# Patient Record
Sex: Female | Born: 1970 | Race: Black or African American | Marital: Single | Smoking: Never smoker
Health system: Southern US, Community
[De-identification: ages and names within clinical notes are randomized; demographics above are authoritative.]

## PROBLEM LIST (undated history)

## (undated) DIAGNOSIS — M549 Dorsalgia, unspecified: Secondary | ICD-10-CM

## (undated) DIAGNOSIS — E079 Disorder of thyroid, unspecified: Secondary | ICD-10-CM

## (undated) DIAGNOSIS — G43909 Migraine, unspecified, not intractable, without status migrainosus: Secondary | ICD-10-CM

## (undated) HISTORY — DX: Disorder of thyroid, unspecified: E07.9

## (undated) HISTORY — DX: Dorsalgia, unspecified: M54.9

## (undated) HISTORY — PX: THYROIDECTOMY, PARTIAL: SHX18

## (undated) HISTORY — DX: Migraine, unspecified, not intractable, without status migrainosus: G43.909

---

## 2020-06-14 DIAGNOSIS — E890 Postprocedural hypothyroidism: Secondary | ICD-10-CM | POA: Insufficient documentation

## 2022-01-29 LAB — COLOGUARD: COLOGUARD: NEGATIVE

## 2022-02-25 ENCOUNTER — Ambulatory Visit (INDEPENDENT_AMBULATORY_CARE_PROVIDER_SITE_OTHER): Payer: BC Managed Care – PPO | Admitting: Certified Nurse Midwife

## 2022-02-25 ENCOUNTER — Encounter: Payer: Self-pay | Admitting: Certified Nurse Midwife

## 2022-02-25 VITALS — BP 140/85 | HR 62 | Ht 66.5 in | Wt 232.4 lb

## 2022-02-25 DIAGNOSIS — Z1231 Encounter for screening mammogram for malignant neoplasm of breast: Secondary | ICD-10-CM

## 2022-02-25 DIAGNOSIS — Z01419 Encounter for gynecological examination (general) (routine) without abnormal findings: Secondary | ICD-10-CM | POA: Diagnosis not present

## 2022-02-25 DIAGNOSIS — Z23 Encounter for immunization: Secondary | ICD-10-CM | POA: Diagnosis not present

## 2022-02-25 MED ORDER — TETANUS-DIPHTH-ACELL PERTUSSIS 5-2.5-18.5 LF-MCG/0.5 IM SUSY
0.5000 mL | PREFILLED_SYRINGE | Freq: Once | INTRAMUSCULAR | Status: AC
  Administered 2022-02-25: 0.5 mL via INTRAMUSCULAR

## 2022-02-25 NOTE — Patient Instructions (Signed)

## 2022-02-25 NOTE — Progress Notes (Signed)
? ? ?GYNECOLOGY ANNUAL PREVENTATIVE CARE ENCOUNTER NOTE ? ?History:    ? Madison Fowler is a 51 y.o. [id number]. female here for a routine annual gynecologic exam.  Current complaints: none.   Denies abnormal vaginal bleeding, discharge, pelvic pain, problems with intercourse or other gynecologic concerns.  ?  ? ?Social ?Relationship: divorced , with current partner 15 yrs ( FOB of 2nd daughter) ?Living: with partner and 57 yr old daughter ?Work:  Clinical cytogeneticist [employer]  ?Exercise: few times week  ?Smoke/Alcohol/drug use: occasional alcohol, no smoking, vaping or drug use.  ? ?Gynecologic History ?No LMP recorded (lmp unknown). ?Contraception: IUD, Mirnea 2021 ?Last Pap: 04/08/2020. Results were: normal (NO HPV done) ?Last mammogram: 2019. Results were: normal per pt  ? ?Obstetric History ?OB History  ?No obstetric history on file.  ? ? ?Past Medical History:  ?Diagnosis Date  ? Back pain   ? Migraine   ? Thyroid disease   ? ? ?Past Surgical History:  ?Procedure Laterality Date  ? THYROIDECTOMY, PARTIAL Bilateral   ? ? ?No current outpatient medications on file prior to visit.  ? ?No current facility-administered medications on file prior to visit.  ? ? ?Not on File ? ?Social History:  reports that she has never smoked. She has never used smokeless tobacco. She reports that she does not currently use alcohol. She reports that she does not use drugs. ? ?History reviewed. No pertinent family history. ? ?The following portions of the patient's history were reviewed and updated as appropriate: allergies, current medications, past family history, past medical history, past social history, past surgical history and problem list. ? ?Review of Systems ?Pertinent items noted in HPI and remainder of comprehensive ROS otherwise negative. ? ?Physical Exam:  ?BP 140/85   Pulse 62   Ht 5' 6.5" (1.689 m)   Wt 232 lb 6.4 oz (105.4 kg)   LMP  (LMP Unknown)   BMI 36.95 kg/m?  ?CONSTITUTIONAL: Well-developed,  well-nourished, over weight female in no acute distress.  ?HENT:  Normocephalic, atraumatic, External right and left ear normal. Oropharynx is clear and moist ?EYES: Conjunctivae and EOM are normal. Pupils are equal, round, and reactive to light. No scleral icterus.  ?NECK: Normal range of motion, supple, no masses.  Normal thyroid.  ?SKIN: Skin is warm and dry. No rash noted. Not diaphoretic. No erythema. No pallor. ?MUSCULOSKELETAL: Normal range of motion. No tenderness.  No cyanosis, clubbing, or edema.  2+ distal pulses. ?NEUROLOGIC: Alert and oriented to person, place, and time. Normal reflexes, muscle tone coordination.  ?PSYCHIATRIC: Normal mood and affect. Normal behavior. Normal judgment and thought content. ?CARDIOVASCULAR: Normal heart rate noted, regular rhythm ?RESPIRATORY: Clear to auscultation bilaterally. Effort and breath sounds normal, no problems with respiration noted. ?BREASTS: Symmetric in size. No masses, tenderness, skin changes, nipple drainage, or lymphadenopathy bilaterally.  ?ABDOMEN: Soft, no distention noted.  No tenderness, rebound or guarding.  ?PELVIC: Normal appearing external genitalia and urethral meatus; normal appearing vaginal mucosa and cervix.  No abnormal discharge noted.  Pap smear not due. IUD strings present.  Normal uterine size, no other palpable masses, no uterine or adnexal tenderness.  . ?  ?Assessment and Plan:  ?  1. Women's annual routine gynecological examination ? ? ?Pap: due 2024 ?Mammogram : ordered ?Labs: declines ?Refills: none ?Colonoscopy: pt had colo Guard testing 2023-Negative ?Referral: none ?Routine preventative health maintenance measures emphasized. ?Please refer to After Visit Summary for other counseling recommendations.  ?   ? ?Philip Aspen, CNM ?  [facility] ?[facility]   ?

## 2022-04-08 ENCOUNTER — Ambulatory Visit
Admission: RE | Admit: 2022-04-08 | Discharge: 2022-04-08 | Disposition: A | Discharge status: Home / Self Care | Payer: BC Managed Care – PPO | Source: Ambulatory Visit | Attending: Certified Nurse Midwife | Admitting: Certified Nurse Midwife

## 2022-04-08 ENCOUNTER — Ambulatory Visit (INDEPENDENT_AMBULATORY_CARE_PROVIDER_SITE_OTHER): Payer: BC Managed Care – PPO | Admitting: Certified Nurse Midwife

## 2022-04-08 VITALS — BP 119/76 | HR 65

## 2022-04-08 DIAGNOSIS — Z0130 Encounter for examination of blood pressure without abnormal findings: Secondary | ICD-10-CM

## 2022-04-08 DIAGNOSIS — Z1231 Encounter for screening mammogram for malignant neoplasm of breast: Secondary | ICD-10-CM | POA: Diagnosis not present

## 2022-04-08 NOTE — Progress Notes (Signed)
Nurse visit only , for blood pressure check.  ? ?Philip Aspen, CNM  ?

## 2022-04-14 ENCOUNTER — Inpatient Hospital Stay
Admission: RE | Admit: 2022-04-14 | Discharge: 2022-04-14 | Disposition: A | Discharge status: Home / Self Care | Payer: Self-pay | Source: Ambulatory Visit | Attending: *Deleted | Admitting: *Deleted

## 2022-04-14 ENCOUNTER — Other Ambulatory Visit: Payer: Self-pay | Admitting: *Deleted

## 2022-04-14 DIAGNOSIS — Z1231 Encounter for screening mammogram for malignant neoplasm of breast: Secondary | ICD-10-CM

## 2023-01-25 ENCOUNTER — Other Ambulatory Visit: Payer: Self-pay | Admitting: Certified Nurse Midwife

## 2023-01-25 DIAGNOSIS — Z1231 Encounter for screening mammogram for malignant neoplasm of breast: Secondary | ICD-10-CM

## 2023-03-03 ENCOUNTER — Encounter: Payer: Self-pay | Admitting: Certified Nurse Midwife

## 2023-03-03 ENCOUNTER — Ambulatory Visit (INDEPENDENT_AMBULATORY_CARE_PROVIDER_SITE_OTHER): Payer: BC Managed Care – PPO | Admitting: Certified Nurse Midwife

## 2023-03-03 ENCOUNTER — Other Ambulatory Visit (HOSPITAL_COMMUNITY)
Admission: RE | Admit: 2023-03-03 | Discharge: 2023-03-03 | Disposition: A | Discharge status: Home / Self Care | Payer: BC Managed Care – PPO | Source: Ambulatory Visit | Attending: Certified Nurse Midwife | Admitting: Certified Nurse Midwife

## 2023-03-03 VITALS — BP 123/85 | HR 88 | Resp 16 | Ht 66.5 in | Wt 187.9 lb

## 2023-03-03 DIAGNOSIS — Z01419 Encounter for gynecological examination (general) (routine) without abnormal findings: Secondary | ICD-10-CM

## 2023-03-03 DIAGNOSIS — Z124 Encounter for screening for malignant neoplasm of cervix: Secondary | ICD-10-CM | POA: Diagnosis present

## 2023-03-03 DIAGNOSIS — Z113 Encounter for screening for infections with a predominantly sexual mode of transmission: Secondary | ICD-10-CM

## 2023-03-03 NOTE — Progress Notes (Signed)
GYNECOLOGY ANNUAL PREVENTATIVE CARE ENCOUNTER NOTE  History:     Madison Fowler is a 52 y.o. No obstetric history on file. female here for a routine annual gynecologic exam.  Current complaints: none.   Denies abnormal vaginal bleeding, discharge, pelvic pain, problems with intercourse or other gynecologic concerns.     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 (Menstrual status: IUD). Contraception: IUD Last Pap: 04/08/2020. Results were: normal Last mammogram: 5/17/223. Results were: normal  Obstetric History OB History  No obstetric history on file.    Past Medical History:  Diagnosis Date   Back pain    Migraine    Thyroid disease     Past Surgical History:  Procedure Laterality Date   THYROIDECTOMY, PARTIAL Bilateral     Current Outpatient Medications on File Prior to Visit  Medication Sig Dispense Refill   fluticasone (FLONASE) 50 MCG/ACT nasal spray 1 spray into each nostril daily.     ibuprofen (ADVIL) 800 MG tablet Take 1 tablet by mouth every 8 (eight) hours as needed.     levonorgestrel (MIRENA) 20 MCG/DAY IUD 1 each by Intrauterine route once.     levothyroxine (SYNTHROID) 75 MCG tablet Take 75 mcg by mouth daily.     No current facility-administered medications on file prior to visit.    Not on File  Social History:  reports that she has never smoked. She has never used smokeless tobacco. She reports that she does not currently use alcohol. She reports that she does not use drugs.  Family History  Problem Relation Age of Onset   Breast cancer Neg Hx     The following portions of the patient's history were reviewed and updated as appropriate: allergies, current medications, past family history, past medical history, past social history, past  surgical history and problem list.  Review of Systems Pertinent items noted in HPI and remainder of comprehensive ROS otherwise negative.  Physical Exam:  BP 123/85   Pulse 88   Resp 16   Ht 5' 6.5" (1.689 m)   Wt 187 lb 14.4 oz (85.2 kg)   BMI 29.87 kg/m  CONSTITUTIONAL: Well-developed, well-nourished female in no acute distress.  HENT:  Normocephalic, atraumatic, External right and left ear normal. Oropharynx is clear and moist EYES: Conjunctivae and EOM are normal. Pupils are equal, round, and reactive to light. No scleral icterus.  NECK: Normal range of motion, supple, no masses.  Normal thyroid.  SKIN: Skin is warm and dry. No rash noted. Not diaphoretic. No erythema. No pallor. MUSCULOSKELETAL: Normal range of motion. No tenderness.  No cyanosis, clubbing, or edema.  2+ distal pulses. NEUROLOGIC: Alert and oriented to person, place, and time. Normal reflexes, muscle tone coordination.  PSYCHIATRIC: Normal mood and affect. Normal behavior. Normal judgment and thought content. CARDIOVASCULAR: Normal heart rate noted, regular rhythm RESPIRATORY: Clear to auscultation bilaterally. Effort and breath sounds normal, no problems with respiration noted. BREASTS: Symmetric in size. No masses, tenderness, skin changes, nipple drainage, or lymphadenopathy bilaterally.  ABDOMEN: Soft, no distention noted.  No tenderness, rebound or guarding.  PELVIC: Normal appearing external genitalia and urethral meatus; normal appearing vaginal mucosa and cervix.  No abnormal discharge noted.  Pap smear obtained.Strings present.  Normal uterine size, no other palpable masses, no uterine or adnexal tenderness.  Marland Kitchen  Assessment and Plan:    1. Encounter for annual routine gynecological examination    Pap: Will follow up results of pap smear and manage accordingly. Mammogram : has scheduled 04/15/2023 Labs: HIV , & Hep C Refills: none  Referral: none  Routine preventative health maintenance measures  emphasized. Please refer to After Visit Summary for other counseling recommendations.      Doreene Burke, CNM [facility]  [facility],  [facility]

## 2023-03-03 NOTE — Patient Instructions (Signed)
Preventive Care 40-50 Years Old, Female Preventive care refers to lifestyle choices and visits with your health care provider that can promote health and wellness. Preventive care visits are also called wellness exams. What can I expect for my preventive care visit? Counseling Your health care provider may ask you questions about your: Medical history, including: Past medical problems. Family medical history. Pregnancy history. Current health, including: Menstrual cycle. Method of birth control. Emotional well-being. Home life and relationship well-being. Sexual activity and sexual health. Lifestyle, including: Alcohol, nicotine or tobacco, and drug use. Access to firearms. Diet, exercise, and sleep habits. Work and work environment. Sunscreen use. Safety issues such as seatbelt and bike helmet use. Physical exam Your health care provider will check your: Height and weight. These may be used to calculate your BMI (body mass index). BMI is a measurement that tells if you are at a healthy weight. Waist circumference. This measures the distance around your waistline. This measurement also tells if you are at a healthy weight and may help predict your risk of certain diseases, such as type 2 diabetes and high blood pressure. Heart rate and blood pressure. Body temperature. Skin for abnormal spots. What immunizations do I need?  Vaccines are usually given at various ages, according to a schedule. Your health care provider will recommend vaccines for you based on your age, medical history, and lifestyle or other factors, such as travel or where you work. What tests do I need? Screening Your health care provider may recommend screening tests for certain conditions. This may include: Lipid and cholesterol levels. Diabetes screening. This is done by checking your blood sugar (glucose) after you have not eaten for a while (fasting). Pelvic exam and Pap test. Hepatitis B test. Hepatitis C  test. HIV (human immunodeficiency virus) test. STI (sexually transmitted infection) testing, if you are at risk. Lung cancer screening. Colorectal cancer screening. Mammogram. Talk with your health care provider about when you should start having regular mammograms. This may depend on whether you have a family history of breast cancer. BRCA-related cancer screening. This may be done if you have a family history of breast, ovarian, tubal, or peritoneal cancers. Bone density scan. This is done to screen for osteoporosis. Talk with your health care provider about your test results, treatment options, and if necessary, the need for more tests. Follow these instructions at home: Eating and drinking  Eat a diet that includes fresh fruits and vegetables, whole grains, lean protein, and low-fat dairy products. Take vitamin and mineral supplements as recommended by your health care provider. Do not drink alcohol if: Your health care provider tells you not to drink. You are pregnant, may be pregnant, or are planning to become pregnant. If you drink alcohol: Limit how much you have to 0-1 drink a day. Know how much alcohol is in your drink. In the [place] one drink equals one 12 oz bottle of beer (355 mL), one 5 oz glass of wine (148 mL), or one 1 oz glass of hard liquor (44 mL). Lifestyle Brush your teeth every morning and night with fluoride toothpaste. Floss one time each day. Exercise for at least 30 minutes 5 or more days each week. Do not use any products that contain nicotine or tobacco. These products include cigarettes, chewing tobacco, and vaping devices, such as e-cigarettes. If you need help quitting, ask your health care provider. Do not use drugs. If you are sexually active, practice safe sex. Use a condom or other form of protection to   prevent STIs. If you do not wish to become pregnant, use a form of birth control. If you plan to become pregnant, see your health care provider for a  prepregnancy visit. Take aspirin only as told by your health care provider. Make sure that you understand how much to take and what form to take. Work with your health care provider to find out whether it is safe and beneficial for you to take aspirin daily. Find healthy ways to manage stress, such as: Meditation, yoga, or listening to music. Journaling. Talking to a trusted person. Spending time with friends and family. Minimize exposure to UV radiation to reduce your risk of skin cancer. Safety Always wear your seat belt while driving or riding in a vehicle. Do not drive: If you have been drinking alcohol. Do not ride with someone who has been drinking. When you are tired or distracted. While texting. If you have been using any mind-altering substances or drugs. Wear a helmet and other protective equipment during sports activities. If you have firearms in your house, make sure you follow all gun safety procedures. Seek help if you have been physically or sexually abused. What's next? Visit your health care provider once a year for an annual wellness visit. Ask your health care provider how often you should have your eyes and teeth checked. Stay up to date on all vaccines. This information is not intended to replace advice given to you by your health care provider. Make sure you discuss any questions you have with your health care provider. Document Revised: 05/07/2021 Document Reviewed: 05/07/2021 Elsevier Patient Education  2023 [employer]vier Inc[employer]  

## 2023-03-04 LAB — HEPATITIS C ANTIBODY: Hep C Virus Ab: NONREACTIVE

## 2023-03-04 LAB — HIV ANTIBODY (ROUTINE TESTING W REFLEX): HIV Screen 4th Generation wRfx: NONREACTIVE

## 2023-03-08 LAB — CYTOLOGY - PAP
Comment: NEGATIVE
Diagnosis: NEGATIVE
Diagnosis: REACTIVE
High risk HPV: NEGATIVE

## 2023-04-15 ENCOUNTER — Ambulatory Visit
Admission: RE | Admit: 2023-04-15 | Discharge: 2023-04-15 | Disposition: A | Discharge status: Home / Self Care | Payer: BC Managed Care – PPO | Source: Ambulatory Visit | Attending: Certified Nurse Midwife | Admitting: Certified Nurse Midwife

## 2023-04-15 DIAGNOSIS — Z1231 Encounter for screening mammogram for malignant neoplasm of breast: Secondary | ICD-10-CM | POA: Diagnosis present

## 2023-04-20 ENCOUNTER — Other Ambulatory Visit: Payer: Self-pay | Admitting: Certified Nurse Midwife

## 2023-04-20 DIAGNOSIS — R928 Other abnormal and inconclusive findings on diagnostic imaging of breast: Secondary | ICD-10-CM

## 2023-04-20 DIAGNOSIS — N6489 Other specified disorders of breast: Secondary | ICD-10-CM

## 2023-04-22 ENCOUNTER — Other Ambulatory Visit: Payer: Self-pay | Admitting: Certified Nurse Midwife

## 2023-04-22 ENCOUNTER — Encounter: Payer: Self-pay | Admitting: Certified Nurse Midwife

## 2023-04-22 DIAGNOSIS — R928 Other abnormal and inconclusive findings on diagnostic imaging of breast: Secondary | ICD-10-CM

## 2023-04-26 ENCOUNTER — Ambulatory Visit
Admission: RE | Admit: 2023-04-26 | Discharge: 2023-04-26 | Disposition: A | Discharge status: Home / Self Care | Payer: BC Managed Care – PPO | Source: Ambulatory Visit | Attending: Certified Nurse Midwife | Admitting: Certified Nurse Midwife

## 2023-04-26 DIAGNOSIS — R928 Other abnormal and inconclusive findings on diagnostic imaging of breast: Secondary | ICD-10-CM | POA: Diagnosis present

## 2023-04-26 DIAGNOSIS — N6489 Other specified disorders of breast: Secondary | ICD-10-CM | POA: Insufficient documentation

## 2023-07-04 IMAGING — MG MM DIGITAL SCREENING BILAT W/ TOMO AND CAD
6 of 10 series · 6 of 30 positions shown · non-contrast
Comparison: Previous exam(s).

CLINICAL DATA: Screening.

EXAM:
DIGITAL SCREENING BILATERAL MAMMOGRAM WITH TOMOSYNTHESIS AND CAD
TECHNIQUE: Bilateral screening digital craniocaudal and mediolateral oblique
mammograms were obtained. Bilateral screening digital breast
tomosynthesis was performed. The images were evaluated with
computer-aided detection.

[R CV synth-2D]
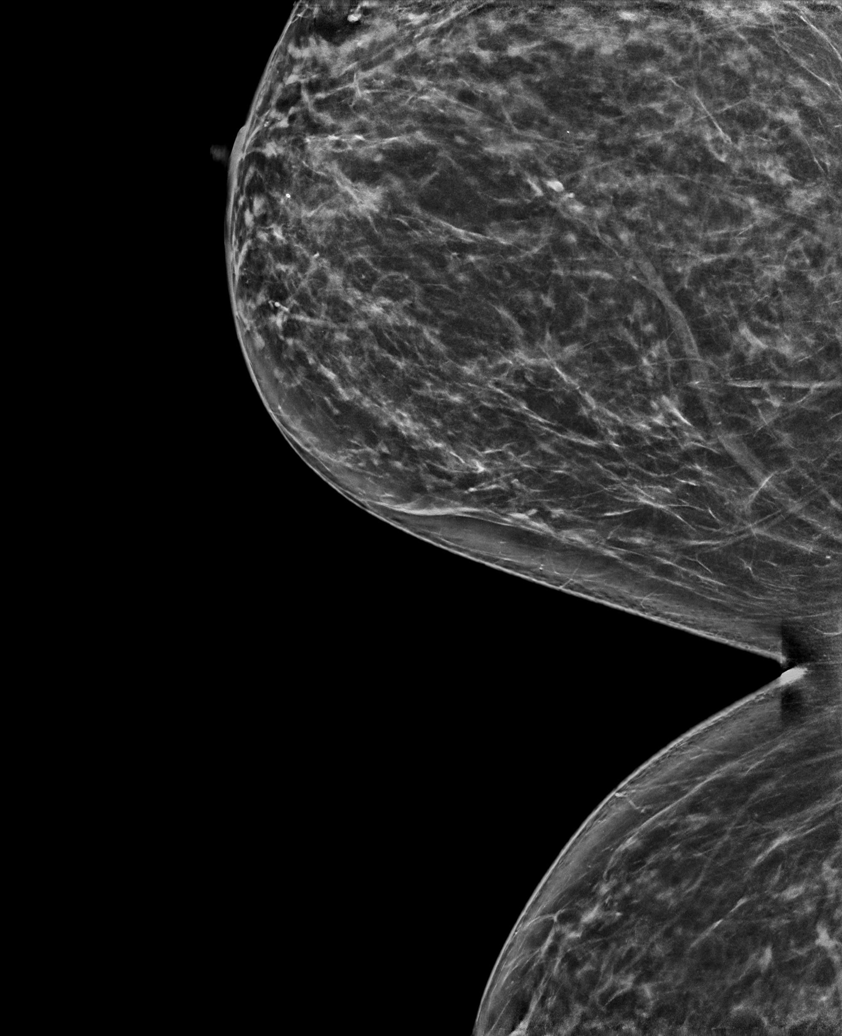

[R MLO synth-2D]
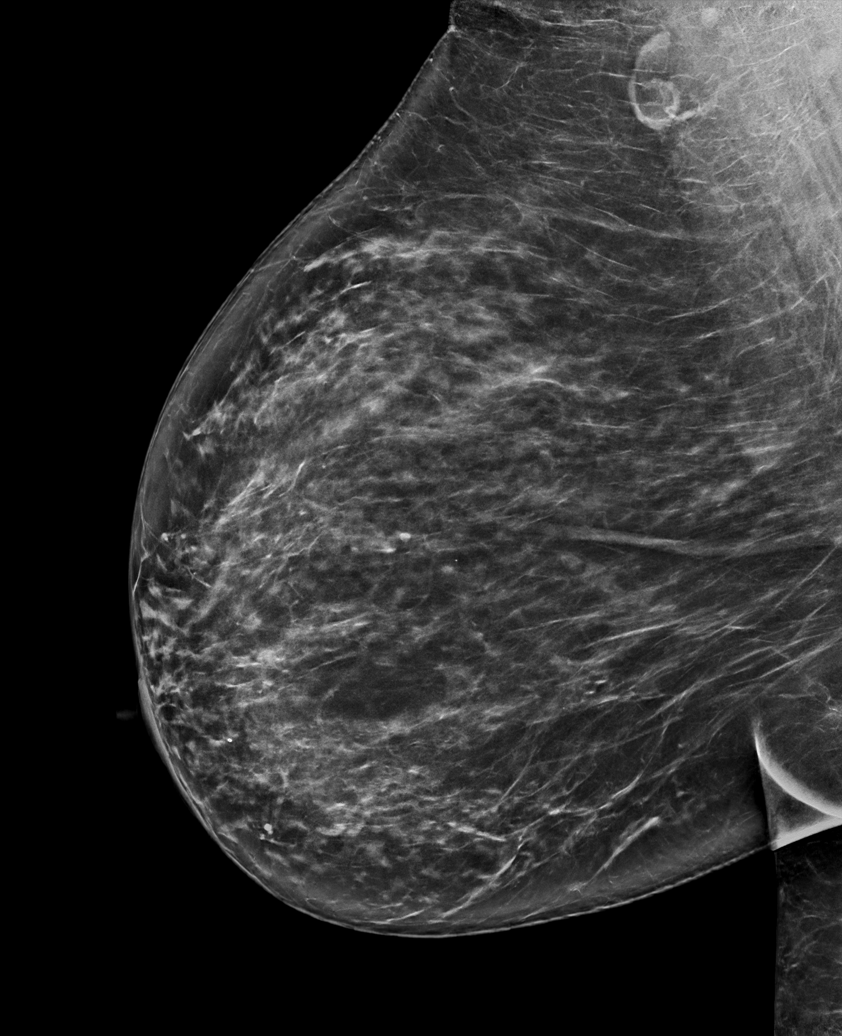

[L CC synth-2D]
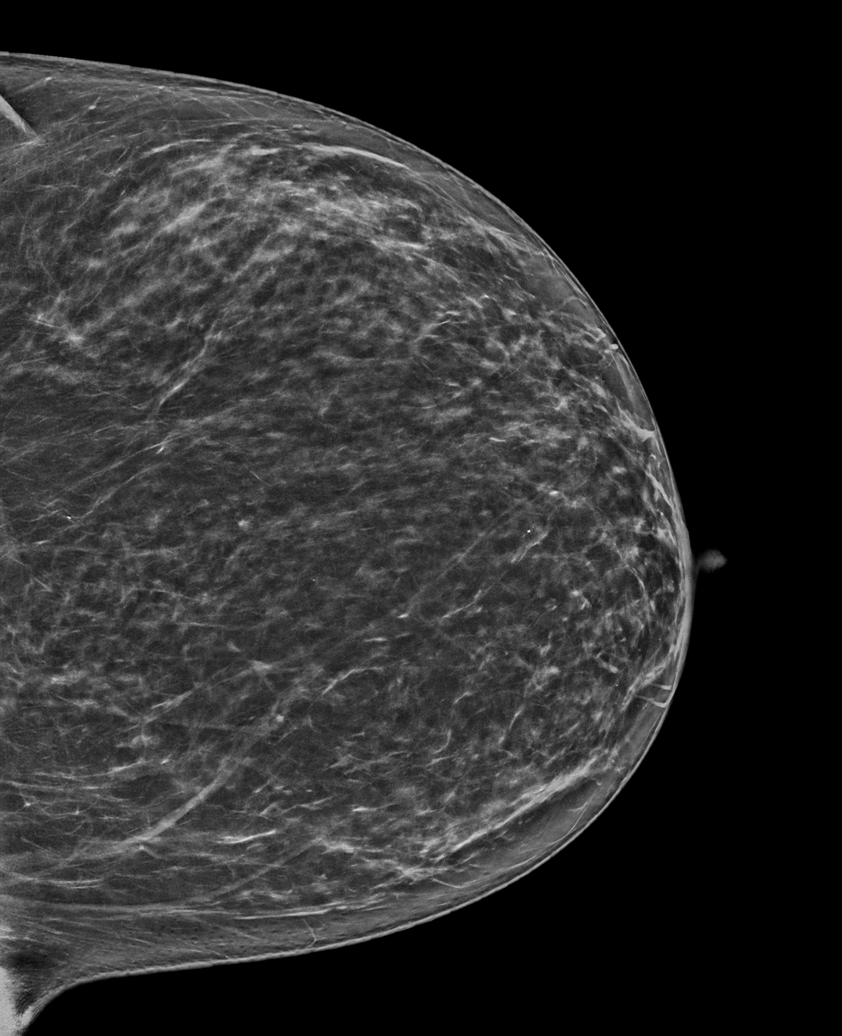

[L MLO synth-2D]
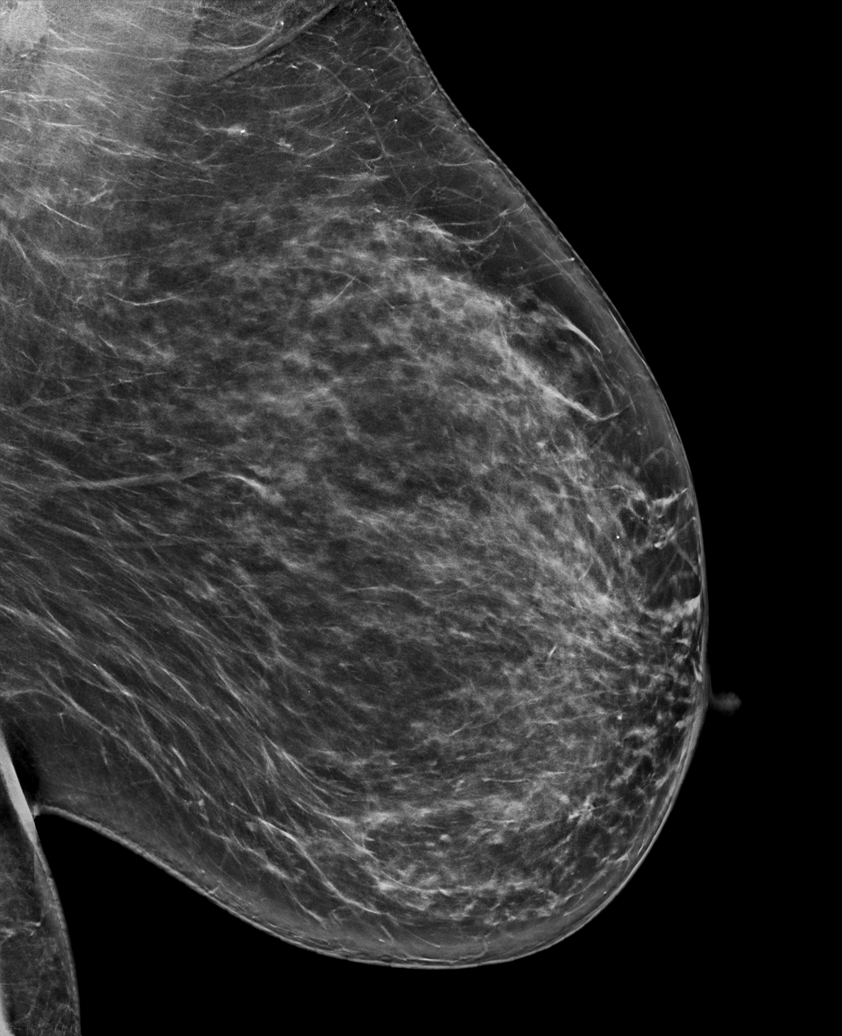

[R CC synth-2D]
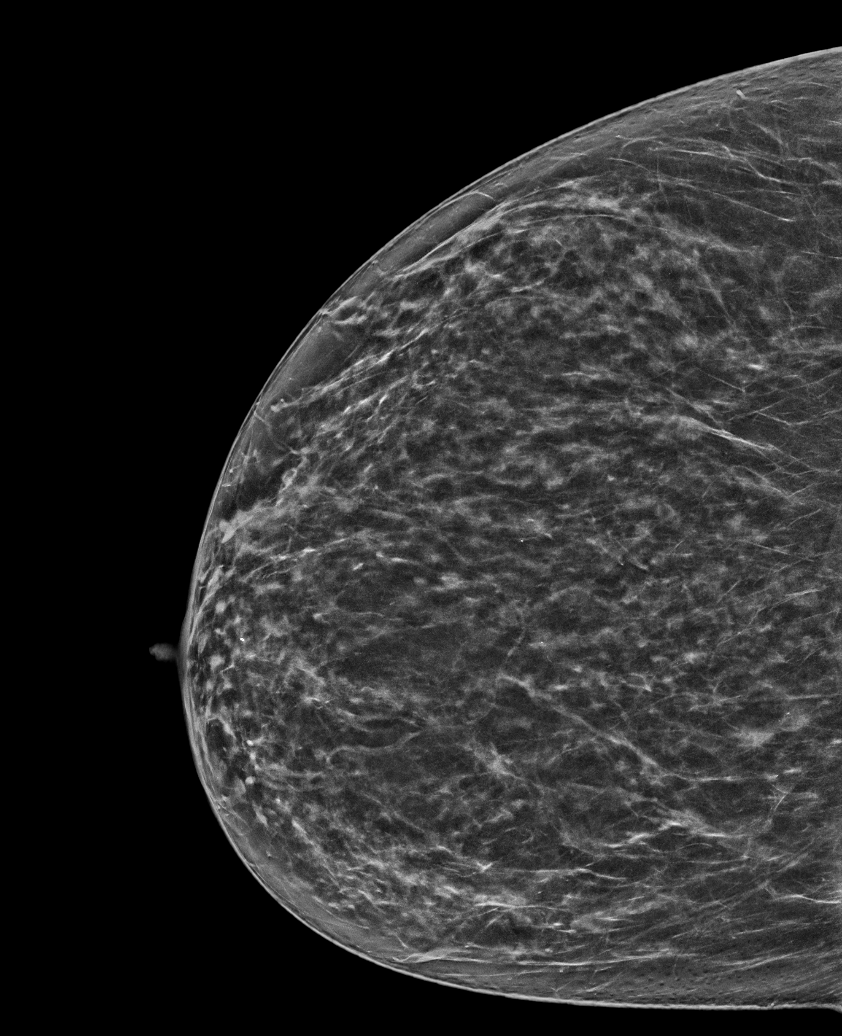

[R CV tomo · tomo slice 42/83.0]
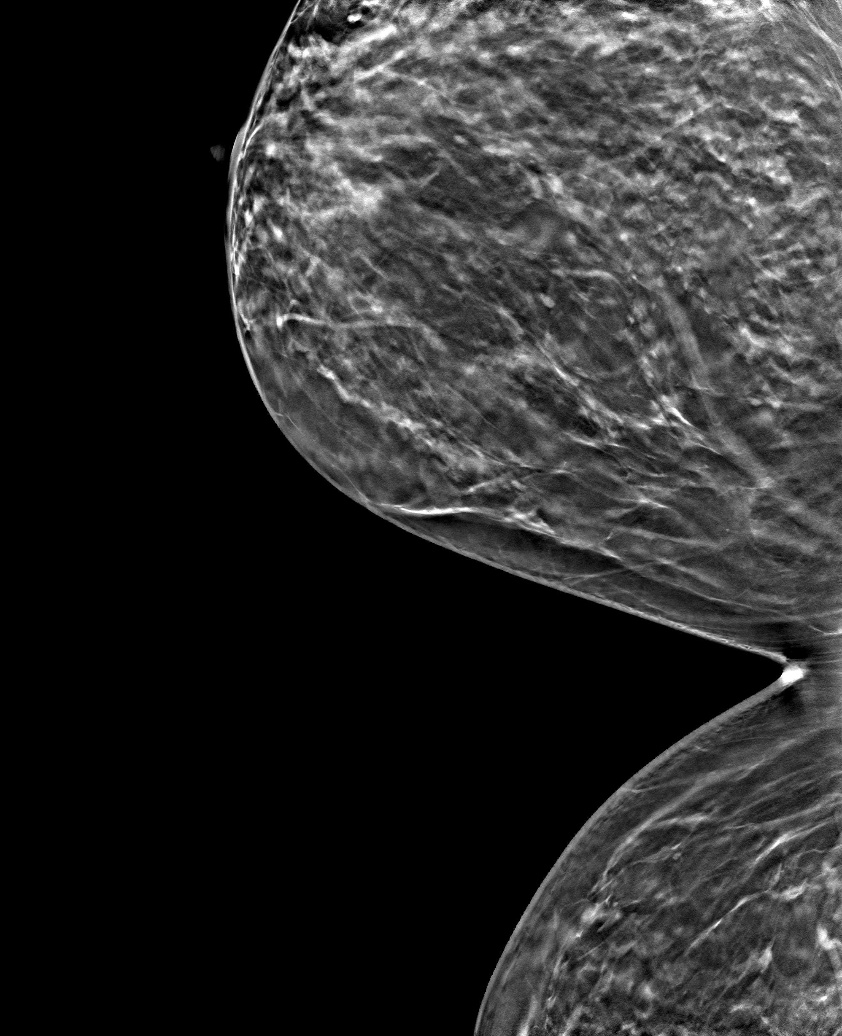

[6 of 30 positions shown; findings below may reference images not displayed]

ACR Breast Density Category c: The breast tissue is heterogeneously
dense, which may obscure small masses.
FINDINGS: There are no findings suspicious for malignancy.
IMPRESSION: No mammographic evidence of malignancy. A result letter of this
screening mammogram will be mailed directly to the patient.

RECOMMENDATION:
Screening mammogram in one year. (Code:[id number])

BI-RADS CATEGORY  1: Negative.

## 2024-03-27 ENCOUNTER — Other Ambulatory Visit: Payer: Self-pay | Admitting: Certified Nurse Midwife

## 2024-03-27 DIAGNOSIS — Z1231 Encounter for screening mammogram for malignant neoplasm of breast: Secondary | ICD-10-CM

## 2024-04-18 ENCOUNTER — Ambulatory Visit (INDEPENDENT_AMBULATORY_CARE_PROVIDER_SITE_OTHER): Payer: Self-pay | Admitting: Certified Nurse Midwife

## 2024-04-18 ENCOUNTER — Encounter: Payer: Self-pay | Admitting: Certified Nurse Midwife

## 2024-04-18 VITALS — BP 124/85 | HR 69 | Ht 66.5 in | Wt 193.9 lb

## 2024-04-18 DIAGNOSIS — Z01419 Encounter for gynecological examination (general) (routine) without abnormal findings: Secondary | ICD-10-CM

## 2024-04-18 NOTE — Patient Instructions (Signed)
 Preventive Care 16-51 Years Old, Female  Preventive care refers to lifestyle choices and visits with your health care provider that can promote health and wellness. Preventive care visits are also called wellness exams.  What can I expect for my preventive care visit?  Counseling  Your health care provider may ask you questions about your:  Medical history, including:  Past medical problems.  Family medical history.  Pregnancy history.  Current health, including:  Menstrual cycle.  Method of birth control.  Emotional well-being.  Home life and relationship well-being.  Sexual activity and sexual health.  Lifestyle, including:  Alcohol, nicotine or tobacco, and drug use.  Access to firearms.  Diet, exercise, and sleep habits.  Work and work Astronomer.  Sunscreen use.  Safety issues such as seatbelt and bike helmet use.  Physical exam  Your health care provider will check your:  Height and weight. These may be used to calculate your BMI (body mass index). BMI is a measurement that tells if you are at a healthy weight.  Waist circumference. This measures the distance around your waistline. This measurement also tells if you are at a healthy weight and may help predict your risk of certain diseases, such as type 2 diabetes and high blood pressure.  Heart rate and blood pressure.  Body temperature.  Skin for abnormal spots.  What immunizations do I need?    Vaccines are usually given at various ages, according to a schedule. Your health care provider will recommend vaccines for you based on your age, medical history, and lifestyle or other factors, such as travel or where you work.  What tests do I need?  Screening  Your health care provider may recommend screening tests for certain conditions. This may include:  Lipid and cholesterol levels.  Diabetes screening. This is done by checking your blood sugar (glucose) after you have not eaten for a while (fasting).  Pelvic exam and Pap test.  Hepatitis B test.  Hepatitis C  test.  HIV (human immunodeficiency virus) test.  STI (sexually transmitted infection) testing, if you are at risk.  Lung cancer screening.  Colorectal cancer screening.  Mammogram. Talk with your health care provider about when you should start having regular mammograms. This may depend on whether you have a family history of breast cancer.  BRCA-related cancer screening. This may be done if you have a family history of breast, ovarian, tubal, or peritoneal cancers.  Bone density scan. This is done to screen for osteoporosis.  Talk with your health care provider about your test results, treatment options, and if necessary, the need for more tests.  Follow these instructions at home:  Eating and drinking    Eat a diet that includes fresh fruits and vegetables, whole grains, lean protein, and low-fat dairy products.  Take vitamin and mineral supplements as recommended by your health care provider.  Do not drink alcohol if:  Your health care provider tells you not to drink.  You are pregnant, may be pregnant, or are planning to become pregnant.  If you drink alcohol:  Limit how much you have to 0-1 drink a day.  Know how much alcohol is in your drink. In the [place] one drink equals one 12 oz bottle of beer (355 mL), one 5 oz glass of wine (148 mL), or one 1 oz glass of hard liquor (44 mL).  Lifestyle  Brush your teeth every morning and night with fluoride toothpaste. Floss one time each day.  Exercise for at least  30 minutes 5 or more days each week.  Do not use any products that contain nicotine or tobacco. These products include cigarettes, chewing tobacco, and vaping devices, such as e-cigarettes. If you need help quitting, ask your health care provider.  Do not use drugs.  If you are sexually active, practice safe sex. Use a condom or other form of protection to prevent STIs.  If you do not wish to become pregnant, use a form of birth control. If you plan to become pregnant, see your health care provider for a  prepregnancy visit.  Take aspirin only as told by your health care provider. Make sure that you understand how much to take and what form to take. Work with your health care provider to find out whether it is safe and beneficial for you to take aspirin daily.  Find healthy ways to manage stress, such as:  Meditation, yoga, or listening to music.  Journaling.  Talking to a trusted person.  Spending time with friends and family.  Minimize exposure to UV radiation to reduce your risk of skin cancer.  Safety  Always wear your seat belt while driving or riding in a vehicle.  Do not drive:  If you have been drinking alcohol. Do not ride with someone who has been drinking.  When you are tired or distracted.  While texting.  If you have been using any mind-altering substances or drugs.  Wear a helmet and other protective equipment during sports activities.  If you have firearms in your house, make sure you follow all gun safety procedures.  Seek help if you have been physically or sexually abused.  What's next?  Visit your health care provider once a year for an annual wellness visit.  Ask your health care provider how often you should have your eyes and teeth checked.  Stay up to date on all vaccines.  This information is not intended to replace advice given to you by your health care provider. Make sure you discuss any questions you have with your health care provider.  Document Revised: 05/07/2021 Document Reviewed: 05/07/2021  Elsevier Patient Education  2024 [employer].

## 2024-04-18 NOTE — Progress Notes (Signed)
 GYNECOLOGY ANNUAL PREVENTATIVE CARE ENCOUNTER NOTE  History:      Nicola Heinemann is a 53 y.o. G67P2002 female here for a routine annual gynecologic exam.  Current complaints: none, patient would like to make sure that her IUD is in place. .   Denies abnormal vaginal bleeding, discharge, pelvic pain, problems with intercourse or other gynecologic concerns.     Social Relationship:Single Living:significant other , 2 children at home Work:full time- Conservator, museum/gallery Exercise:15 min daily Smoke/Alcohol/drug use:No history of of tobacco, occasional alcohol use, no history of drug use.   Gynecologic History No LMP recorded (within months). (Menstrual status: IUD). Contraception: IUD Last Pap: 03/03/2023. Results were: normal with negative HPV Last mammogram: 04/26/2023. Results were: normal Had cologaurd  Obstetric History OB History  Gravida Para Term Preterm AB Living  2 2 2   2   SAB IAB Ectopic Multiple Live Births      2    # Outcome Date GA Lbr Len/2nd Weight Sex Type Anes PTL Lv  2 Term 06/24/09    F    LIV  1 Term 05/17/99    F   N LIV    Past Medical History:  Diagnosis Date   Back pain    Migraine    Thyroid disease     Past Surgical History:  Procedure Laterality Date   THYROIDECTOMY, PARTIAL Bilateral     Current Outpatient Medications on File Prior to Visit  Medication Sig Dispense Refill   chlorhexidine (PERIDEX) 0.12 % solution 3 (three) times daily.     ibuprofen (ADVIL) 800 MG tablet Take 1 tablet by mouth every 8 (eight) hours as needed.     levonorgestrel (MIRENA) 20 MCG/DAY IUD 1 each by Intrauterine route once.     levothyroxine (SYNTHROID) 75 MCG tablet Take 75 mcg by mouth daily.     fluticasone (FLONASE) 50 MCG/ACT nasal spray 1 spray into each nostril daily.     No current facility-administered medications on file prior to visit.    No Known Allergies  Social History:  reports that she has never smoked. She has never used smokeless  tobacco. She reports that she does not currently use alcohol. She reports that she does not use drugs.  Family History  Problem Relation Age of Onset   Breast cancer Neg Hx     The following portions of the patient's history were reviewed and updated as appropriate: allergies, current medications, past family history, past medical history, past social history, past surgical history and problem list.  Review of Systems Pertinent items noted in HPI and remainder of comprehensive ROS otherwise negative.  Physical Exam:  BP 124/85   Pulse 69   Ht 5' 6.5" (1.689 m)   Wt 193 lb 14.4 oz (88 kg)   LMP  (Within Months)   BMI 30.83 kg/m  CONSTITUTIONAL: Well-developed, well-nourished female in no acute distress.  HENT:  Normocephalic, atraumatic, External right and left ear normal. Oropharynx is clear and moist EYES: Conjunctivae and EOM are normal. Pupils are equal, round, and reactive to light. No scleral icterus.  NECK: Normal range of motion, supple, no masses.  Normal thyroid.  SKIN: Skin is warm and dry. No rash noted. Not diaphoretic. No erythema. No pallor. MUSCULOSKELETAL: Normal range of motion. No tenderness.  No cyanosis, clubbing, or edema.  2+ distal pulses. NEUROLOGIC: Alert and oriented to person, place, and time. Normal reflexes, muscle tone coordination.  PSYCHIATRIC: Normal mood and affect. Normal behavior.  Normal judgment and thought content. CARDIOVASCULAR: Normal heart rate noted, regular rhythm RESPIRATORY: Clear to auscultation bilaterally. Effort and breath sounds normal, no problems with respiration noted. BREASTS: Symmetric in size. No masses, tenderness, skin changes, nipple drainage, or lymphadenopathy bilaterally.  ABDOMEN: Soft, no distention noted.  No tenderness, rebound or guarding.  PELVIC: Normal appearing external genitalia and urethral meatus; normal appearing vaginal mucosa and cervix.  No abnormal discharge noted.  Pap smear not due. IUD strings present.  Normal uterine size, no other palpable masses, no uterine or adnexal tenderness.  .   Assessment and Plan:    Annual well women GYN exam  Pap: not due Mammogram : scheduled  Labs: none Refills: none Referral: none Discussed menopause : average age 89. Discussed timing of removal IUD in relationship to menopause . She would like to wait until 18-32 years old.  Routine preventative health maintenance measures emphasized. Please refer to After Visit Summary for other counseling recommendations.      Alise Appl, CNM [facility]  [facility],  [facility]

## 2024-05-05 ENCOUNTER — Ambulatory Visit
Admission: RE | Admit: 2024-05-05 | Discharge: 2024-05-05 | Disposition: A | Discharge status: Home / Self Care | Payer: Self-pay | Source: Ambulatory Visit | Attending: Certified Nurse Midwife

## 2024-05-05 DIAGNOSIS — Z1231 Encounter for screening mammogram for malignant neoplasm of breast: Secondary | ICD-10-CM | POA: Insufficient documentation
